# Patient Record
Sex: Male | Born: 1980 | Race: Black or African American | Hispanic: No | Marital: Single | Smoking: Never smoker
Health system: Southern US, Community
[De-identification: ages and names within clinical notes are randomized; demographics above are authoritative.]

## PROBLEM LIST (undated history)

## (undated) DIAGNOSIS — I10 Essential (primary) hypertension: Secondary | ICD-10-CM

## (undated) HISTORY — PX: ABDOMINAL SURGERY: SHX537

---
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
## 2001-01-09 ENCOUNTER — Ambulatory Visit (HOSPITAL_COMMUNITY): Admission: RE | Admit: 2001-01-09 | Discharge: 2001-01-09 | Payer: Self-pay | Admitting: *Deleted

## 2001-01-09 ENCOUNTER — Encounter: Payer: Self-pay | Admitting: *Deleted

## 2001-01-09 ENCOUNTER — Encounter: Admission: RE | Admit: 2001-01-09 | Discharge: 2001-01-09 | Payer: Self-pay | Admitting: *Deleted

## 2001-08-14 ENCOUNTER — Inpatient Hospital Stay (HOSPITAL_COMMUNITY): Admission: EM | Admit: 2001-08-14 | Discharge: 2001-08-16 | Payer: Self-pay | Admitting: Emergency Medicine

## 2002-08-24 ENCOUNTER — Encounter: Payer: Self-pay | Admitting: Emergency Medicine

## 2002-08-24 ENCOUNTER — Ambulatory Visit (HOSPITAL_COMMUNITY): Admission: RE | Admit: 2002-08-24 | Discharge: 2002-08-24 | Payer: Self-pay | Admitting: Emergency Medicine

## 2002-08-24 ENCOUNTER — Emergency Department (HOSPITAL_COMMUNITY): Admission: EM | Admit: 2002-08-24 | Discharge: 2002-08-24 | Payer: Self-pay | Admitting: Emergency Medicine

## 2002-11-10 ENCOUNTER — Emergency Department (HOSPITAL_COMMUNITY): Admission: EM | Admit: 2002-11-10 | Discharge: 2002-11-10 | Payer: Self-pay | Admitting: Emergency Medicine

## 2003-12-12 ENCOUNTER — Emergency Department (HOSPITAL_COMMUNITY): Admission: EM | Admit: 2003-12-12 | Discharge: 2003-12-12 | Payer: Self-pay | Admitting: Emergency Medicine

## 2004-05-08 ENCOUNTER — Emergency Department (HOSPITAL_COMMUNITY): Admission: EM | Admit: 2004-05-08 | Discharge: 2004-05-08 | Payer: Self-pay | Admitting: Family Medicine

## 2006-10-06 IMAGING — CR DG HAND COMPLETE 3+V*L*
3 series · 3 of 3 positions shown · non-contrast
Comparison: none

CLINICAL DATA: Fell on concrete 24 hours ago, now having pain left forth and fifth metacarpal areas. 
 LEFT HAND COMPLETE:
 Three views of the left hand show no definite fracture, dislocation or radiopaque foreign body.  There does appear to be some prominence of the proximal interphalangeal joint soft tissues of the index, middle and ring fingers.

[view not recorded (1 of 3)]
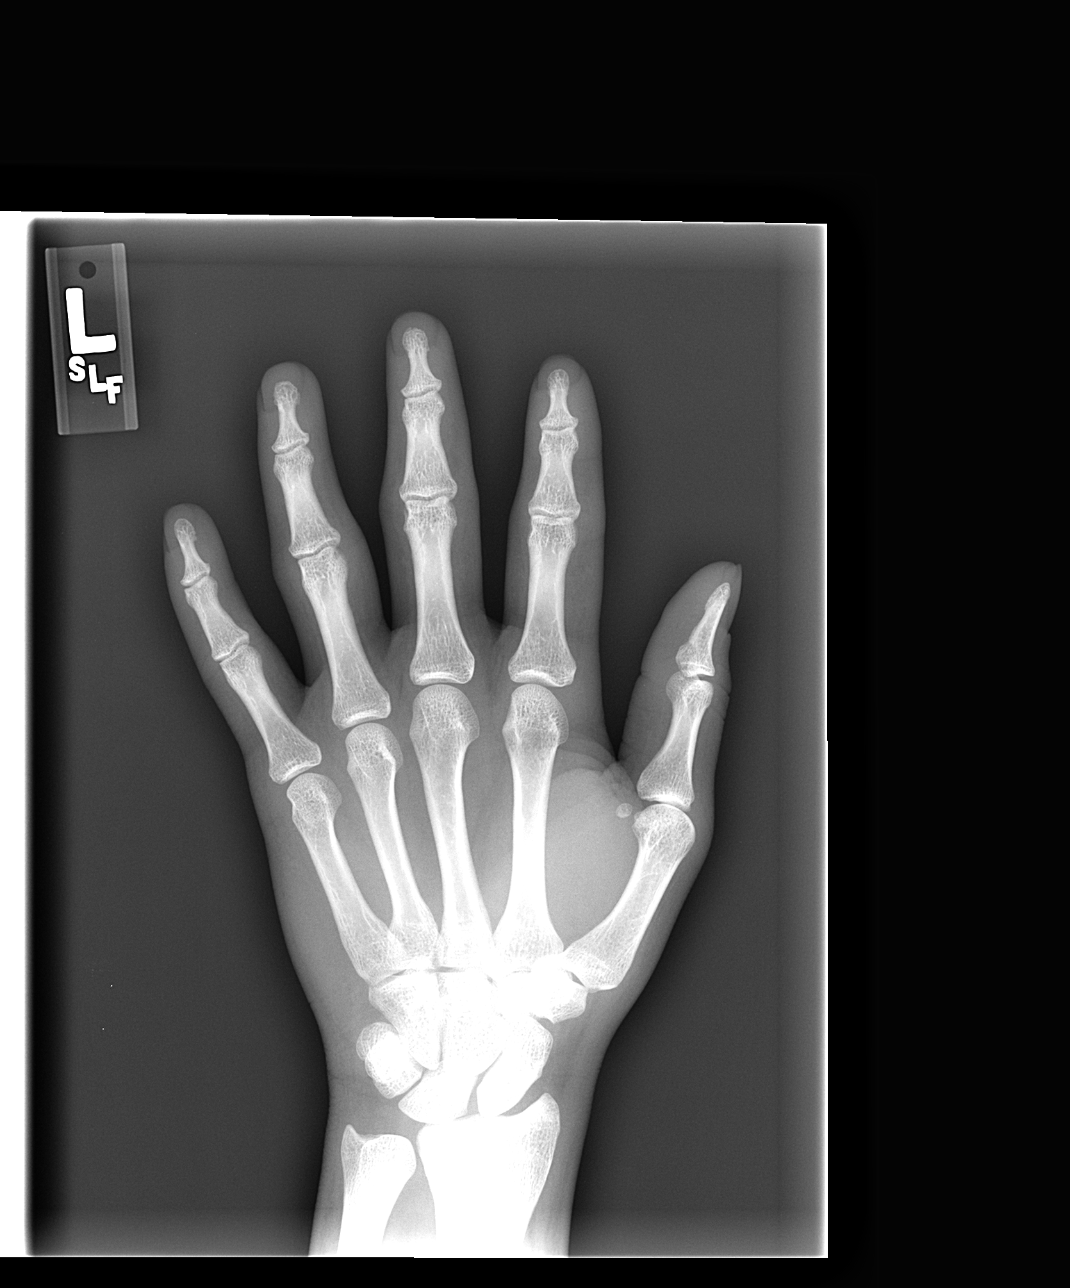

[view not recorded (2 of 3)]
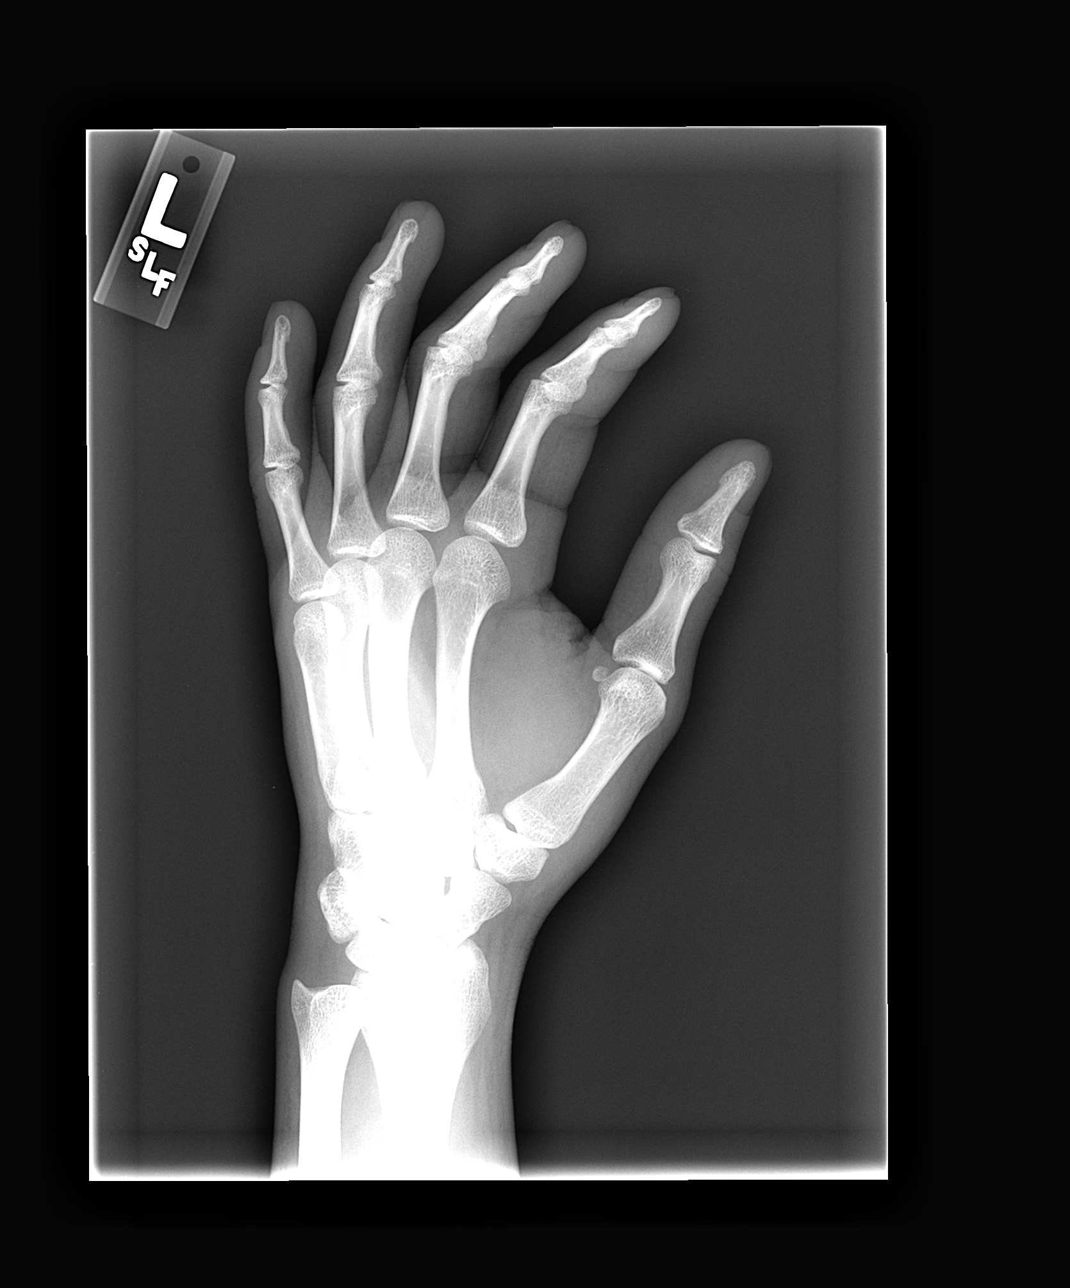

[view not recorded (3 of 3)]
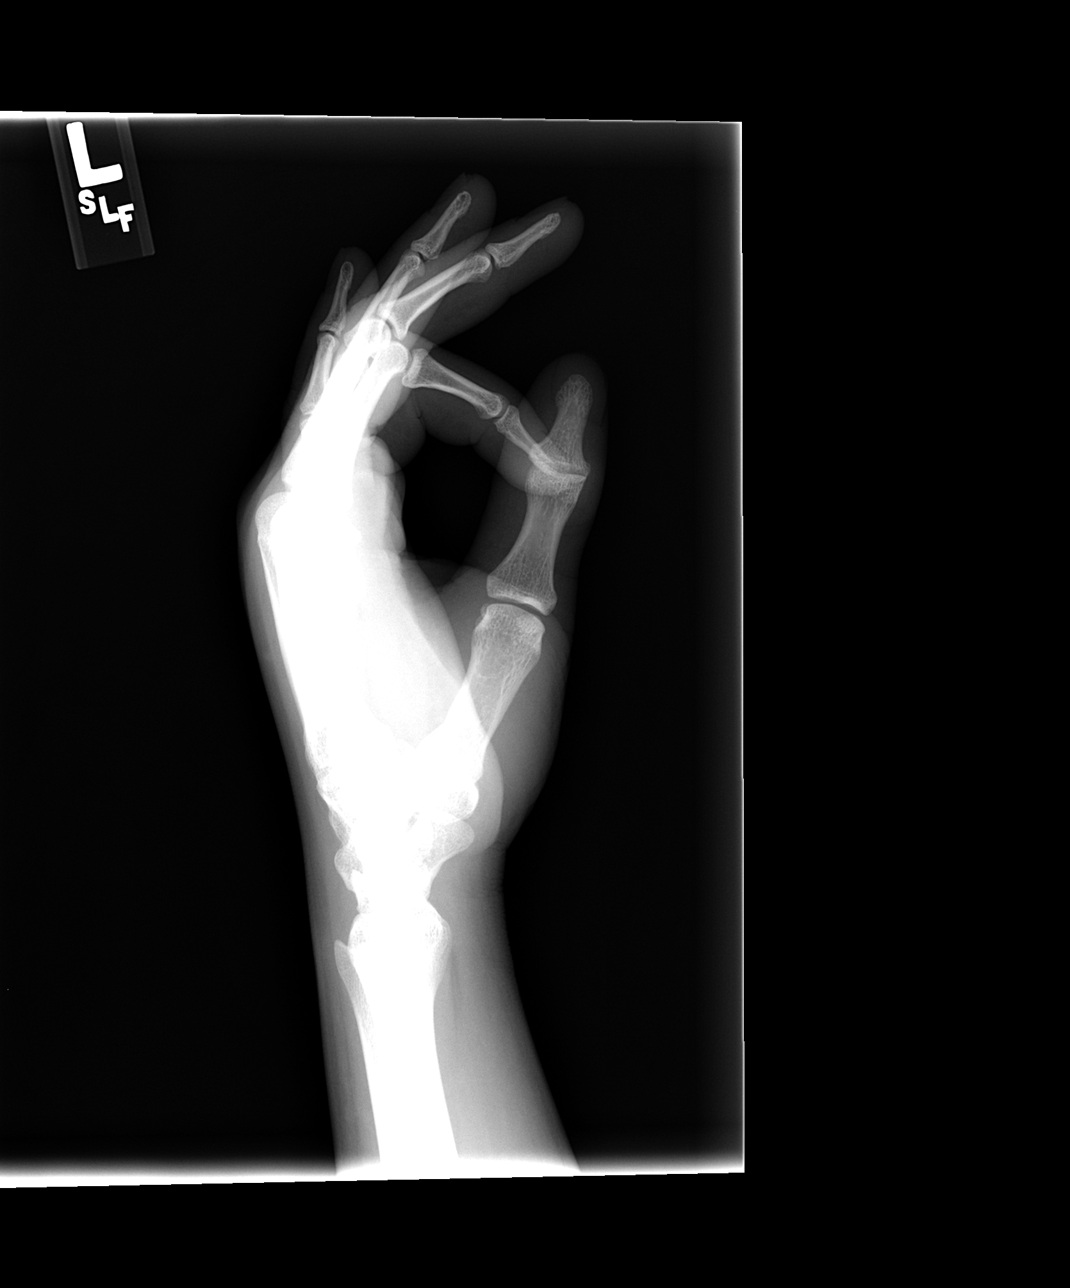

[3 of 3 positions shown; findings below may reference images not displayed]

IMPRESSION: Soft tissue swelling proximal interphalangeal joint regions index, middle and ring fingers.  No fracture or foreign body.

## 2009-12-20 ENCOUNTER — Emergency Department (HOSPITAL_COMMUNITY): Admission: EM | Admit: 2009-12-20 | Discharge: 2009-12-20 | Payer: Self-pay | Admitting: Emergency Medicine

## 2010-05-20 LAB — COMPREHENSIVE METABOLIC PANEL
ALT: 27 U/L (ref 0–53)
AST: 22 U/L (ref 0–37)
Albumin: 4.3 g/dL (ref 3.5–5.2)
Alkaline Phosphatase: 67 U/L (ref 39–117)
GFR calc Af Amer: >60 mL/min (ref >60)
Glucose, Bld: 82 mg/dL (ref 70–99)
Potassium: 4 mEq/L (ref 3.5–5.1)
Sodium: 140 mEq/L (ref 135–145)
Total Protein: 7.3 g/dL (ref 6.0–8.3)

## 2010-05-20 LAB — URINALYSIS, ROUTINE W REFLEX MICROSCOPIC
Glucose, UA: NEGATIVE mg/dL
Hgb urine dipstick: NEGATIVE
Specific Gravity, Urine: 1.027 (ref 1.005–1.030)
Urobilinogen, UA: 4 mg/dL — ABNORMAL HIGH (ref 0.0–1.0)

## 2010-05-20 LAB — URINE MICROSCOPIC-ADD ON

## 2010-07-24 NOTE — Procedures (Signed)
[place]. [facility]  Patient:    Johnny Scott, Johnny Scott Visit Number: [id number] MRN: [id number]          Service Type: MED Location: [phone] Attending Physician:  Pricilla Riffle Dictated by:   Doylene Canning. Ladona Ridgel, M.D. [facility] Proc. Date: 08/16/01 Admit Date:  08/14/2001 Discharge Date: 08/16/2001   CC:         Elsie Stain, M.D.  Lorelle Formosa, M.D.  Dietrich Pates, M.D. [facility]   Procedure Report  PROCEDURE:  Invasive electrophysiologic testing.  INDICATION:  Unexplained tachycardia in the setting of heart block of unclear etiology.  The procedure was done to determine the mechanism of the patients tachycardia and to measure the patients AV conduction times to assess for the risk of development of high-grade heart block.  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  His ejection fraction has ranged from 20-50%, typically dependent on whether he has taken his medications or not.  He has a history of noncompliance.  He was admitted to the hospital after experiencing palpitations associated with an argument with his girlfriend.  At that time he had numbness and tingling in his hands and arms and legs as well as perioral numbness and chest pain.  The patient is now referred for evaluation.  DESCRIPTION OF PROCEDURE:  After informed consent was obtained, the patient was taken to the diagnostic EP lab in the fasted state.  After the usual preparation and draping, intravenous fentanyl and midazolam were given for sedation.  A 5 French quadripolar catheter was inserted percutaneously in the right femoral vein and advanced to the RV apex.  A 5 French quadripolar catheter was inserted percutaneously in the right femoral vein and advanced to the His bundle region.  A 5 French quadripolar catheter was inserted percutaneously in the right femoral vein and advanced to the right  atrium. After measurement of the basic intervals, rapid ventricular pacing was carried out from the RV apex, demonstrating VA Wenckebach at 580 msec.  Next programmed ventricular stimulation was carried out from the RV apex at a basic drive cycle length of both 500 and 400 msec.  S1-S2, S1-S2-S3, and S1-S2-S3-S4 stimuli were delivered with the S1-S2, S2-S3, and S3-S4 intervals stepwise decreased down to ventricular refractoriness.  During programmed ventricular stimulation from the RV apex at multiple drive cycle lengths including long-short coupled cycle lengths, there was no inducible VT.  Next rapid atrial pacing was carried out from the right atrium and stepwise decreased from 600 mg down to 460 msec, where AV Wenckebach was observed.  During rapid atrial pacing the PR interval remained less than the RR interval.  There was no inducible SVT.  It should be noted that during block there was an A without subsequent H or QRS, suggesting block higher in the AV conduction system.  In addition, the QRS interval was narrow.  Next programmed atrial stimulation was carried out from the high right atrium at a basic drive cycle length of 469 msec.  The S1-S2 interval was stepwise decreased from 540 msec down to 390 msec, where the AV node ERP was observed.  During programmed atrial stimulation there was a rare AH jump but no echo beats and no inducible SVT. The S1-S2 interval was stepwise decreased subsequently from 390 down to 220 msec to evaluate for the possibility of atrial tachycardia.  At an S1-S2 coupling interval of 600/220, there was inducible atrial  fibrillation.  The patient remained in atrial fibrillation for approximately five minutes before being more heavily sedated and subsequently DC cardioverted with 300 joules of synchronized energy.  At this point the catheters were removed, hemostasis was assured, and the patient was returned to his room in satisfactory  condition.  COMPLICATIONS:  There were no immediate procedural complications.  RESULTS:  A. BASELINE ELECTROCARDIOGRAM:  The baseline ECG demonstrates sinus    tachycardia with first degree AV block.  B. BASELINE INTERVALS:  The sinus node cycle length was 583 msec, the PR    interval 225 msec, the QRS duration 102 msec, the HV interval 51 msec, the    AH interval 137 msec.  C. RAPID VENTRICULAR PACING:  Rapid ventricular pacing from the RV apex    demonstrated a VA Wenckebach cycle length of 580 msec.  During rapid    ventricular pacing there was no inducible VT.  D. PROGRAMMED VENTRICULAR STIMULATION:  Programmed ventricular stimulation    from the RV apex was carried out at basic drive cycle lengths of 161 and    400 msec.  S1-S2, S1-S2-S3, and S1-S2-S3-S4 stimuli were delivered and    stepwise decreased down to ventricular refractoriness.  There was no    inducible VT.  E. RAPID ATRIAL PACING:  Rapid atrial pacing was carried out from the high    right atrium and demonstrated an AV Wenckebach cycle length of 460 msec.    During rapid atrial pacing the PR interval remained less than the RR    interval.  F. PROGRAMMED ATRIAL STIMULATION:  Programmed atrial stimulation was carried    out from the high right atrium at a basic drive cycle length of 096 msec.    The S1-S2 interval was stepwise decreased from 540 msec down to 480 msec,    where the AV node ERP was observed.  During programmed atrial stimulation    there was an occasional AH jump but no echo beats and no inducible SVT.    Subsequent decrements in the S1-S2 interval down to 220 msec resulted in    the initiation of atrial fibrillation.  G. ARRHYTHMIAS OBSERVED:  Atrial fibrillation.  Initiation:  Programmed    atrial stimulation.  Duration:  Sustained.  Termination:  Synchronized DC    cardioversion.  CONCLUSION:  The study failed to demonstrate any evidence of inducible VT or  SVT.  There was inducible atrial  fibrillation utilizing programmed atrial stimulation, which was sustained and terminated with DC cardioversion with 300 joules of synchronized energy.  In addition, the patients HV interval was demonstrated to be within normal limits.  At baseline his AV node conduction was somewhat sluggish, though there was no significant evidence of His-Purkinje system conduction disease.  Because of his inducible atrial fibrillation, isoproterenol was not delivered because of the likelihood of additional inducible atrial fibrillation. Dictated by:   Doylene Canning. Ladona Ridgel, M.D. [facility] Attending Physician:  Pricilla Riffle DD:  08/16/01 TD:  08/18/01 Job: [id number]

## 2010-07-24 NOTE — H&P (Signed)
[place]. [facility]  Patient:    Johnny Scott, Johnny Scott Visit Number: [id number] MRN: [id number]          Service Type: MED Location: [phone] Attending Physician:  Pricilla Riffle Dictated by:   Madolyn Frieze Jens Som, M.D. [facility] Admit Date:  08/14/2001                           History and Physical  HISTORY OF PRESENT ILLNESS:  The patient is a 30 year old gentleman with a past medical history of cardiomyopathy who we are asked to evaluate for supraventricular tachycardia and chest pain.  The patient has previously been followed by Dr. Elsie Stain for cardiomyopathy (presumed viral in etiology, although not definitive).  Dr. Dietrich Pates recently assumed his care. Apparently, the patient has had significant problems with compliance in the past.  His ejection fraction initially improved with medications, but then he discontinued those and his LV function worsened.  His most recent echocardiogram in November of 2002 showed an ejection fraction of 20-25% with mild mitral and tricuspid regurgitation.  Also of note, he did have a Holter monitor in June of 2002 by Dr. Candis Musa that apparently revealed an average heart rate of 124 and a maximum of 187; I do not have those strips available for review.  Today, the patient had an argument with his girlfriend and he developed an "anxiety attack."  There was numbness in his hands and feet as well as chest pain that was described as a sharp sensation and increased with inspiration; there was also increased shortness of breath.  The episode lasted for approximately 30 minutes and then resolved; he subsequently came to the emergency room.  Of note, he denies any history of exertional chest pain, orthopnea, PND or pedal edema.  There is no history of syncope.  He also denies palpitations.  MEDICATIONS:  He is supposed to be taking Digitek, enalapril and atenolol, but has not taken any of these in months.  ALLERGIES:  He has  no known drug allergies.  SOCIAL HISTORY:  He denies any alcohol, drug or tobacco use.  FAMILY HISTORY:  Noncontributory.  PAST MEDICAL HISTORY:  His past medical history is significant for the cardiomyopathy as described above but is otherwise unremarkable.  REVIEW OF SYSTEMS:  He denies any fever or chills or productive cough and there is no hemoptysis.  He does occasionally have migraines.  There is no dysphagia, odynophagia, melena or hematochezia.  There is no dysuria or hematuria.  There is no rash or seizure activity.  The remaining systems are negative.  PHYSICAL EXAMINATION:  VITAL SIGNS:  His physical exam shows a blood pressure of 116/56 and his pulse is 130 on arrival.  He is afebrile.  GENERAL:  He is well-developed and well-nourished, in no acute distress.  SKIN:  Warm and dry.  HEENT:  Unremarkable with normal eyelids.  NECK:  His neck is supple with a normal upstroke bilaterally and there are no bruits noted.  There is no jugular venous distention and no thyromegaly noted.  CHEST:  Clear to auscultation with normal expansion.  CARDIOVASCULAR:  Exam reveals a tachycardic rate but a regular rhythm.  There is a 2-3/6 systolic murmur at the apex that radiates to the left axilla.  ABDOMEN:  Not tender or distended.  Positive bowel sounds.  No hepatosplenomegaly and no masses appreciated.  There is no abdominal bruit. He has 2+ femoral pulses bilaterally  and no bruits.  EXTREMITIES:  His extremities show no edema and I can palpate no cords.  He has 2+ dorsalis pedis pulses bilaterally.  NEUROLOGIC:  Exam is grossly intact.  LABORATORY AND ACCESSORY DATA:  His electrocardiogram today shows either a sinus tachycardia or an ectopic atrial tachycardia at a rate of 132.  The axis is normal.  There is biatrial enlargement and nonspecific inferolateral ST changes.  There is also a first-degree A-V block.  A review of the patients rhythm strips shows either sinus  tachycardia or an ectopic atrial tachycardia with block.  DIAGNOSES: 1. Nonischemic cardiomyopathy. 2. Possible ectopic atrial tachycardia.  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  His rhythm strips reveal either a sinus tachycardia with intermittent complete heart block or atrial tachycardia with block.  I discussed the patient with Dr. Nathen May and we will admit to telemetry and follow his rhythm. He will need a formal EP evaluation tomorrow morning.  Also, given his Holter results from June of 2002 that revealed an average heart rate of 124 with a maximum of 187, it raises the potential for an abnormal rhythm and tachycardia-mediated cardiomyopathy.  We will resume ACE inhibition with Altace 2.5 mg p.o. q.d.  We will hold on beta blockade and digoxin at this point until he is seen by the electrophysiologist.  I stressed compliance with medications with the patient today, as this has been a significant issue for him in the past.  To be complete, we will check cardiac enzymes and also given the pleuritic nature of his shortness of breath and cardiomyopathy, we will check a D-dimer to exclude pulmonary embolus. Dictated by:   Madolyn Frieze. Jens Som, M.D. [facility] Attending Physician:  Pricilla Riffle DD:  08/14/01 TD:  08/16/01 Job: [id number]

## 2010-07-24 NOTE — Discharge Summary (Signed)
[place]. [facility]  Patient:    Johnny Scott, Johnny Scott Visit Number: [id number] MRN: [id number]          Service Type: MED Location: [phone] Attending Physician:  Pricilla Riffle Dictated by:   Chinita Pester, N.P. Admit Date:  08/14/2001 Discharge Date: 08/16/2001   CC:         Dietrich Pates, M.D. [facility]  Christy Sartorius, M.D.   Discharge Summary  PRIMARY DIAGNOSIS:  Cardiomyopathy and supraventricular tachycardia.  HISTORY OF PRESENT ILLNESS:  This is a 30 year old gentleman with a past medical history of cardiomyopathy who was seen for an episode of supraventricular tachycardia and chest pain.  The patient has been previously followed by Dr. Lorna Few for cardiomyopathy, presumed viral in etiology although not definitive.  Dr. Dietrich Pates recently assumed his care. Apparently the patient has had significant problems with compliance in the past.  His ejection fraction initially improved with medications but then he discontinued those and his left ventricular function worsened.  His most recent echocardiogram in November, 2002 showed an ejection fraction of 20 to 25% with mild mitral and tricuspid regurgitation, also of note he did have a Holter monitor in June, 2002 by Dr. Candis Musa that apparently revealed an average heart rate of 124 and maximum of 187.  Strips were not available for review.  The patient had an argument with his girlfriend developing an anxiety attack. There was numbness in his hands and feet as well as chest that was described as sharp sensation with increase with inspiration.  There was also increased shortness of breath that lasted approximately 30 minutes and resolved. Subsequently he came to the emergency department.  Of note he denies any history of exertional chest pain, orthopnea, paroxysmal nocturnal dyspnea, pedal edema and no history of syncope. He also denies palpitations.  The patient is supposed to be taking  Digitek, Enalapril and atenolol but has not taken any of these medications in months.  HOSPITAL COURSE:  The patient was admitted and seen by Dr. Lewayne Bunting from electrophysiology and it was determined to do an EP study on the patient. Also a 2D echocardiogram was performed.  The 2D echocardiogram had been ordered but was not done at this time.  The patient underwent EP study which showed no inducible VT or supraventricular tachycardia, inducible atrial fibrillation of unclear significance.  He had a DC cardioversion in atrial fibrillation with normal HV interval.  Recommendations were to continue his current medications and have a 2D echocardiogram as an outpatient in six to eight weeks.  The patient was to be discharged to home on the following medications:  DISCHARGE MEDICATIONS: 1. Atenolol 12.5 mg daily. 2. Altace 5 mg daily. 3. Digoxin 0.125 mg daily.  ACTIVITY:  Patient is not to do any heavy lifting or strenuous activity for four days and no driving for two days.  DIET:  Low fat, low cholesterol, low salt diet.  WOUND CARE:  The patient was allowed to shower.  Patient is to call if he develops a lump in his groin or neck or any drainage is to call the office.  FOLLOW UP:  An appointment was scheduled with Dr. Ladona Ridgel on September 12, 2001 at 11:00 a.m. and he is to have a 2D echocardiogram performed at the office at [facility] on September 21, 2001 at 10:30 a.m. Dictated by:   Chinita Pester, N.P. Attending Physician:  Pricilla Riffle DD:  08/16/01 TD:  08/18/01 Job: [id number]

## 2010-07-24 NOTE — Consult Note (Signed)
[place]. [facility]  Patient:    Johnny Scott, Johnny Scott Visit Number: [id number] MRN: [id number]          Service Type: MED Location: [phone] Attending Physician:  Pricilla Riffle Dictated by:   Doylene Canning. Ladona Ridgel, M.D. [facility] Proc. Date: 08/15/01 Admit Date:  08/14/2001 Discharge Date: 08/16/2001   CC:         Miguel Aschoff, M.D.  Lorelle Formosa, M.D.   Consultation Report  REQUESTING PHYSICIAN:  Madolyn Frieze. Jens Som, M.D.  INDICATIONS FOR CONSULTATION:  Evaluation of tachycardia and heart block.  HISTORY OF PRESENT ILLNESS:  The patient is a very pleasant 30 year old young man with a history of non-ischemic cardiomyopathy presumed due to viral cardiomyopathy whose LV function at times has been normal and other times has been as low as 20%.  He was in his usual state of health until yesterday when he got into an argument with his girlfriend and noted palpitations and anxiety with numbness in his hands and feet as well as around his mouth.  The patient states that he was hyperventilating.  He presented to the emergency room where he was found to be in a narrow QRS tachycardia with a long PR interval consistent with sinus tachycardia, though an atrial tachycardia could not be excluded.  In addition, at times there was evidence of 3:1 AV conduction suggestive of heart block and he was admitted for additional evaluation.  His chest pain is now resolved and overall he feels much improved.  He denies a history of syncope.  He denies regular palpitations.  Of note, the patient wore a 24-hour monitor that appears to be done back in June where his average heart rate was 124, maximum heart rate 187 and review of those strips on preliminary evaluation suggests episodes of 2:1 AV block as well as what appears to be possible atrial tachycardia, though again, sinus tachycardia with first degree heart block and the P-wave in the T-wave could not be excluded.  PAST  MEDICAL HISTORY:  Additional past medical history is notable for cardiomyopathy as previously noted.  He had an echocardiogram done back in November 2002 which demonstrated an ejection fraction of 20-25% with mild MR and TR.  SOCIAL HISTORY:  The patient is single, lives with his mother in [place]. He is not working.  Denies alcohol or tobacco use.  FAMILY HISTORY:  Notable for no cardiac problems.  REVIEW OF SYSTEMS:  Notable for no fevers, chills, night sweats, or adenopathy.  He denies any headache, nasal bleeding, vision or hearing changes.  He denies any skin rashes or skin lesions or other skin problems. He denies any chest pain, shortness of breath, dyspnea on exertion, PND, or orthopnea except for what was described yesterday.  He denies dysuria, nocturia, hematuria, or urinary frequency.  Denies weakness or numbness except in his hands and feet during his episode of hyperventilation yesterday.  He denies arthralgias, joint swelling, or deformities.  He denies nausea, vomiting, diarrhea, or constipation.  He denies hematemesis.  He denies polyuria, polydipsia, heat or cold intolerance.  PHYSICAL EXAMINATION  GENERAL:  He is a pleasant, well-appearing young man in no distress.  VITAL SIGNS:  Blood pressure today 110/60, pulse 110 and regular, respirations 24, temperature 97.  GENERAL:  He was a well-appearing man in no distress.  HEENT:  Normocephalic, atraumatic.  Pupils equal and round.  Oropharynx was moist.  Sclerae were anicteric.  Oropharynx revealed no erythema or exudates.  NECK:  Supple with 7-8 cm jugular venous distention.  There were no bruits. Carotid upstrokes were 2+ and symmetric.  CARDIOVASCULAR:  Regular tachycardia with an S3 gallop.  LUNGS:  Clear bilaterally to auscultation.  SKIN:  No rashes or other lesions.  ABDOMEN:  Soft, nontender, nondistended.  No organomegaly.  Scaphoid.  EXTREMITIES:  No clubbing, cyanosis, edema.  There were no  petechiae lesions.  NEUROLOGIC:  He was alert and oriented x3 with cranial nerves II-XII grossly intact.  Strength 5/5 in the extremities.  Sensation was normal throughout. LABORATORIES:  EKG demonstrates sinus tachycardia with first degree AV block. Review of his rhythm strips demonstrates what appears to be sinus tachycardia, though I cannot exclude a high rate atrial tachycardia with predominant 1:1 AV conduction.  There were episodes of 2:1 and even 3:1 heart block.  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  IMPRESSION: 1. Non-ischemic cardiomyopathy, presumed viral. 2. Medical noncompliance. 3. Anxiety attack secondary to an argument with his girlfriend. 4. Probable sinus tachycardia, though I cannot exclude a high rate atrial    tachycardia. 5. AV block in the setting of mild PR prolongation associated with PP interval    prolongation all consistent with AV Wenckebach block.  RECOMMENDATIONS: 1. Proceed with 2-D echocardiogram to see if there has been improvement in his    LV function. 2. EP study tomorrow to define the tachycardia mechanism (exclude high rate    atrial tachycardia and evaluate AV conduction). Dictated by:   Doylene Canning. Ladona Ridgel, M.D. [facility] Attending Physician:  Pricilla Riffle DD:  08/15/01 TD:  08/16/01 Job: [id number]

## 2011-10-23 ENCOUNTER — Emergency Department (HOSPITAL_COMMUNITY)
Admission: EM | Admit: 2011-10-23 | Discharge: 2011-10-23 | Disposition: A | Discharge status: Home / Self Care | Payer: No Typology Code available for payment source | Attending: Emergency Medicine | Admitting: Emergency Medicine

## 2011-10-23 ENCOUNTER — Encounter (HOSPITAL_COMMUNITY): Payer: Self-pay | Admitting: *Deleted

## 2011-10-23 DIAGNOSIS — S139XXA Sprain of joints and ligaments of unspecified parts of neck, initial encounter: Secondary | ICD-10-CM | POA: Insufficient documentation

## 2011-10-23 DIAGNOSIS — M542 Cervicalgia: Secondary | ICD-10-CM | POA: Insufficient documentation

## 2011-10-23 DIAGNOSIS — S161XXA Strain of muscle, fascia and tendon at neck level, initial encounter: Secondary | ICD-10-CM

## 2011-10-23 DIAGNOSIS — M25519 Pain in unspecified shoulder: Secondary | ICD-10-CM | POA: Insufficient documentation

## 2011-10-23 DIAGNOSIS — Y92410 Unspecified street and highway as the place of occurrence of the external cause: Secondary | ICD-10-CM | POA: Insufficient documentation

## 2011-10-23 HISTORY — DX: Essential (primary) hypertension: I10

## 2011-10-23 MED ORDER — DIAZEPAM 5 MG PO TABS: 5.0000 mg | ORAL_TABLET | Freq: Three times a day (TID) | ORAL | Status: AC | PRN

## 2011-10-23 MED ORDER — HYDROCODONE-ACETAMINOPHEN 5-325 MG PO TABS: 1.0000 | ORAL_TABLET | Freq: Once | ORAL | Status: DC

## 2011-10-23 MED ORDER — IBUPROFEN 800 MG PO TABS: 800.0000 mg | ORAL_TABLET | Freq: Three times a day (TID) | ORAL | Status: AC
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
## 2011-10-23 NOTE — ED Provider Notes (Signed)
Medical screening examination/treatment/procedure(s) were performed by non-physician practitioner and as supervising physician I was immediately available for consultation/collaboration.   Gavin Pound. Oletta Lamas, MD 10/23/11 7253
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
## 2011-10-24 NOTE — ED Notes (Signed)
Opened chart to write work note 

## 2021-06-08 ENCOUNTER — Emergency Department (HOSPITAL_COMMUNITY)
Admission: EM | Admit: 2021-06-08 | Discharge: 2021-06-08 | Disposition: A | Discharge status: Home / Self Care | Payer: BC Managed Care – PPO | Attending: Emergency Medicine | Admitting: Emergency Medicine

## 2021-06-08 ENCOUNTER — Other Ambulatory Visit: Payer: Self-pay

## 2021-06-08 ENCOUNTER — Other Ambulatory Visit (HOSPITAL_COMMUNITY): Payer: Self-pay

## 2021-06-08 DIAGNOSIS — W228XXA Striking against or struck by other objects, initial encounter: Secondary | ICD-10-CM | POA: Diagnosis not present

## 2021-06-08 DIAGNOSIS — S0591XA Unspecified injury of right eye and orbit, initial encounter: Secondary | ICD-10-CM | POA: Diagnosis present

## 2021-06-08 DIAGNOSIS — S0501XA Injury of conjunctiva and corneal abrasion without foreign body, right eye, initial encounter: Secondary | ICD-10-CM | POA: Diagnosis not present

## 2021-06-08 MED ORDER — ERYTHROMYCIN 5 MG/GM OP OINT
TOPICAL_OINTMENT | OPHTHALMIC | 0 refills | Status: AC
  Filled 2021-06-08: qty 3.5, 5d supply, fill #0

## 2021-06-08 MED ORDER — FLUORESCEIN SODIUM 1 MG OP STRP
1.0000 | ORAL_STRIP | Freq: Once | OPHTHALMIC | Status: AC
  Administered 2021-06-08: 1 via OPHTHALMIC
  Filled 2021-06-08: qty 1

## 2021-06-08 MED ORDER — TETRACAINE HCL 0.5 % OP SOLN
2.0000 [drp] | Freq: Once | OPHTHALMIC | Status: AC
  Administered 2021-06-08: 2 [drp] via OPHTHALMIC
  Filled 2021-06-08: qty 4
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
## 2021-06-08 NOTE — Discharge Instructions (Addendum)
You have a small abrasion near your right eye, which is causing the redness and irritation you are experiencing.  I have attached some information for you here regarding at home care to help alleviate your symptoms.  I have also sent you in an antibiotic ointment that you will place on your lower eyelid 4 times daily for 5 days. If your vision does not improve or you continue to have symptoms after that, please follow up with your primary care doctor. ?

## 2021-06-08 NOTE — ED Notes (Signed)
Visual Acuity: Pt score on left eye 10/16, pt reports no vision in right eye cannot discern letters, with both eyes pt scored 10/12.5  ?
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
